# Patient Record
Sex: Male | Born: 1985 | Race: White | Hispanic: No | Marital: Single | Smoking: Current every day smoker
Health system: Southern US, Community
[De-identification: ages and names within clinical notes are randomized; demographics above are authoritative.]

## PROBLEM LIST (undated history)

## (undated) DIAGNOSIS — J45909 Unspecified asthma, uncomplicated: Secondary | ICD-10-CM

---

## 2014-12-03 ENCOUNTER — Emergency Department (HOSPITAL_BASED_OUTPATIENT_CLINIC_OR_DEPARTMENT_OTHER)
Admission: EM | Admit: 2014-12-03 | Discharge: 2014-12-03 | Disposition: A | Discharge status: Home / Self Care | Payer: Self-pay | Attending: Emergency Medicine | Admitting: Emergency Medicine

## 2014-12-03 ENCOUNTER — Encounter (HOSPITAL_BASED_OUTPATIENT_CLINIC_OR_DEPARTMENT_OTHER): Payer: Self-pay | Admitting: *Deleted

## 2014-12-03 ENCOUNTER — Emergency Department (HOSPITAL_BASED_OUTPATIENT_CLINIC_OR_DEPARTMENT_OTHER): Payer: Self-pay

## 2014-12-03 DIAGNOSIS — J45901 Unspecified asthma with (acute) exacerbation: Secondary | ICD-10-CM | POA: Insufficient documentation

## 2014-12-03 DIAGNOSIS — J209 Acute bronchitis, unspecified: Secondary | ICD-10-CM

## 2014-12-03 DIAGNOSIS — Z7952 Long term (current) use of systemic steroids: Secondary | ICD-10-CM | POA: Insufficient documentation

## 2014-12-03 DIAGNOSIS — R059 Cough, unspecified: Secondary | ICD-10-CM

## 2014-12-03 DIAGNOSIS — Z720 Tobacco use: Secondary | ICD-10-CM | POA: Insufficient documentation

## 2014-12-03 DIAGNOSIS — R05 Cough: Secondary | ICD-10-CM

## 2014-12-03 HISTORY — DX: Unspecified asthma, uncomplicated: J45.909

## 2014-12-03 MED ORDER — ALBUTEROL SULFATE (2.5 MG/3ML) 0.083% IN NEBU
5.0000 mg | INHALATION_SOLUTION | Freq: Once | RESPIRATORY_TRACT | Status: AC
  Administered 2014-12-03: 5 mg via RESPIRATORY_TRACT
  Filled 2014-12-03: qty 6

## 2014-12-03 MED ORDER — PREDNISONE 10 MG PO TABS
60.0000 mg | ORAL_TABLET | Freq: Once | ORAL | Status: AC
  Administered 2014-12-03: 60 mg via ORAL
  Filled 2014-12-03 (×2): qty 1

## 2014-12-03 MED ORDER — IPRATROPIUM BROMIDE 0.02 % IN SOLN
0.5000 mg | Freq: Once | RESPIRATORY_TRACT | Status: AC
  Administered 2014-12-03: 0.5 mg via RESPIRATORY_TRACT
  Filled 2014-12-03: qty 2.5

## 2014-12-03 MED ORDER — ALBUTEROL SULFATE HFA 108 (90 BASE) MCG/ACT IN AERS
2.0000 | INHALATION_SPRAY | Freq: Once | RESPIRATORY_TRACT | Status: AC
  Administered 2014-12-03: 2 via RESPIRATORY_TRACT
  Filled 2014-12-03: qty 6.7

## 2014-12-03 MED ORDER — IPRATROPIUM-ALBUTEROL 0.5-2.5 (3) MG/3ML IN SOLN
RESPIRATORY_TRACT | Status: AC
  Filled 2014-12-03: qty 3

## 2014-12-03 MED ORDER — PREDNISONE 20 MG PO TABS: ORAL_TABLET | ORAL | Status: AC

## 2014-12-03 MED ORDER — IPRATROPIUM-ALBUTEROL 0.5-2.5 (3) MG/3ML IN SOLN
3.0000 mL | Freq: Once | RESPIRATORY_TRACT | Status: AC
  Administered 2014-12-03: 3 mL via RESPIRATORY_TRACT

## 2014-12-03 MED ORDER — ALBUTEROL SULFATE (2.5 MG/3ML) 0.083% IN NEBU
2.5000 mg | INHALATION_SOLUTION | Freq: Once | RESPIRATORY_TRACT | Status: AC
  Administered 2014-12-03: 2.5 mg via RESPIRATORY_TRACT
  Filled 2014-12-03: qty 3

## 2014-12-03 MED ORDER — HYDROCOD POLST-CHLORPHEN POLST 10-8 MG/5ML PO LQCR: 5.0000 mL | Freq: Two times a day (BID) | ORAL | Status: AC | PRN

## 2014-12-03 NOTE — ED Notes (Signed)
Pt reports frequent travel over the last 6 months- c/o cough and SOB worse for the last 2 weeks

## 2014-12-03 NOTE — ED Provider Notes (Signed)
CSN: [id number]     Arrival date & time 12/03/14  1650 History   First MD Initiated Contact with Patient 12/03/14 1704     Chief Complaint  Patient presents with  . Shortness of Breath  . Cough     (Consider location/radiation/quality/duration/timing/severity/associated sxs/prior Treatment) HPI Comments: 29 year old male presenting with intermittent cough over the past 2 years, worsening over the past 2 weeks. Symptoms worse in the winter. Cough is productive with green mucus, worse in the morning and in the middle of the night. Over the past 2 weeks, he's been experiencing shortness of breath, worse on exertion. Denies fevers. Admits to associated nasal congestion. girlfriend is sick with similar symptoms. He has not tried any alleviating factors for his symptoms. He received a neb treatment on arrival with no relief.  Patient is a 29 y.o. male presenting with shortness of breath and cough. The history is provided by the patient.  Shortness of Breath Associated symptoms: cough   Cough Associated symptoms: shortness of breath     Past Medical History  Diagnosis Date  . Asthma    History reviewed. No pertinent past surgical history. No family history on file. History  Substance Use Topics  . Smoking status: Current Every Day Smoker    Types: Cigarettes  . Smokeless tobacco: Former Neurosurgeon  . Alcohol Use: Yes    Review of Systems  HENT: Positive for congestion.   Respiratory: Positive for cough and shortness of breath.   All other systems reviewed and are negative.     Allergies  Review of patient's allergies indicates no known allergies.  Home Medications   Prior to Admission medications   Medication Sig Start Date End Date Taking? Authorizing Provider  chlorpheniramine-HYDROcodone (TUSSIONEX PENNKINETIC ER) 10-8 MG/5ML LQCR Take 5 mLs by mouth every 12 (twelve) hours as needed for cough. 12/03/14   Kathrynn Speed, PA-C  predniSONE (DELTASONE) 20 MG tablet 2 tabs po daily x  4 days 12/03/14   Nada Boozer Katelin Kutsch, PA-C   BP 147/92 mmHg  Pulse 81  Temp(Src) 97.9 F (36.6 C) (Oral)  Resp 18  Ht  (1.727 m)  Wt 219 lb (99.338 kg)  BMI 33.31 kg/m2  SpO2 100% Physical Exam  Constitutional: He is oriented to person, place, and time. He appears well-developed and well-nourished. No distress.  HENT:  Head: Normocephalic and atraumatic.  Mouth/Throat: No oropharyngeal exudate or posterior oropharyngeal edema.  Nasal mucosal edema. Postnasal drip.  Eyes: Conjunctivae and EOM are normal.  Neck: Normal range of motion. Neck supple.  Cardiovascular: Normal rate, regular rhythm and normal heart sounds.   Pulmonary/Chest: Effort normal.  Diffuse inspiratory and expiratory wheezes bilateral.  Musculoskeletal: Normal range of motion. He exhibits no edema.  Neurological: He is alert and oriented to person, place, and time.  Skin: Skin is warm and dry.  Psychiatric: He has a normal mood and affect. His behavior is normal.  Nursing note and vitals reviewed.   ED Course  Procedures (including critical care time) Labs Review Labs Reviewed - No data to display  Imaging Review Dg Chest 2 View  12/03/2014   CLINICAL DATA:  frequent travel over the last 6 months- c/o cough and SOB worse for the last 2 weeks states he has been cough for 22 years Smoker x 16 years 1 ppd  EXAM: CHEST - 2 VIEW  COMPARISON:  None available  FINDINGS: Lungs are clear. Heart size and mediastinal contours are within normal limits. No effusion. Visualized skeletal  structures are unremarkable.  IMPRESSION: No acute cardiopulmonary disease.   Electronically Signed   By: Corlis Leak  Hassell M.D.   On: 12/03/2014 17:41     EKG Interpretation None      MDM   Final diagnoses:  Bronchitis with bronchospasm  Cough   Nontoxic appearing and in no apparent distress. Afebrile, vital signs stable. Diffuse inspiratory Wheezes bilateral. Patient received neb treatment on arrival ordered in triage. Given cough has  been present for so long, he is a smoker, chest x-ray obtained and negative. After oral prednisone and second DuoNeb treatment, significant improvement of breath sounds. Patient reports he is feeling much better. Will discharge patient home with albuterol inhaler, prednisone burst and Tussionex. Stable for discharge. Resources given for PCP follow-up. Return precautions given. Patient states understanding of treatment care plan and is agreeable.    Kathrynn SpeedRobyn M Cornelis Kluver, PA-C 12/03/14 1825  Geoffery Lyonsouglas Delo, MD 12/04/14 [phone]

## 2014-12-03 NOTE — Discharge Instructions (Signed)
Use albuterol inhaler every 4-6 hours as needed for cough and wheezing. Take prednisone as prescribed, beginning tomorrow as you were given the first dose in the emergency department today. Take Tussionex for severe cough only. No driving or operating heavy machinery while taking tussionex. This medication may cause drowsiness. Follow-up with one of the resources below to establish care with a primary care physician.  Acute Bronchitis Bronchitis is inflammation of the airways that extend from the windpipe into the lungs (bronchi). The inflammation often causes mucus to develop. This leads to a cough, which is the most common symptom of bronchitis.  In acute bronchitis, the condition usually develops suddenly and goes away over time, usually in a couple weeks. Smoking, allergies, and asthma can make bronchitis worse. Repeated episodes of bronchitis may cause further lung problems.  CAUSES Acute bronchitis is most often caused by the same virus that causes a cold. The virus can spread from person to person (contagious) through coughing, sneezing, and touching contaminated objects. SIGNS AND SYMPTOMS   Cough.   Fever.   Coughing up mucus.   Body aches.   Chest congestion.   Chills.   Shortness of breath.   Sore throat.  DIAGNOSIS  Acute bronchitis is usually diagnosed through a physical exam. Your health care provider will also ask you questions about your medical history. Tests, such as chest X-rays, are sometimes done to rule out other conditions.  TREATMENT  Acute bronchitis usually goes away in a couple weeks. Oftentimes, no medical treatment is necessary. Medicines are sometimes given for relief of fever or cough. Antibiotic medicines are usually not needed but may be prescribed in certain situations. In some cases, an inhaler may be recommended to help reduce shortness of breath and control the cough. A cool mist vaporizer may also be used to help thin bronchial secretions and  make it easier to clear the chest.  HOME CARE INSTRUCTIONS  Get plenty of rest.   Drink enough fluids to keep your urine clear or pale yellow (unless you have a medical condition that requires fluid restriction). Increasing fluids may help thin your respiratory secretions (sputum) and reduce chest congestion, and it will prevent dehydration.   Take medicines only as directed by your health care provider.  If you were prescribed an antibiotic medicine, finish it all even if you start to feel better.  Avoid smoking and secondhand smoke. Exposure to cigarette smoke or irritating chemicals will make bronchitis worse. If you are a smoker, consider using nicotine gum or skin patches to help control withdrawal symptoms. Quitting smoking will help your lungs heal faster.   Reduce the chances of another bout of acute bronchitis by washing your hands frequently, avoiding people with cold symptoms, and trying not to touch your hands to your mouth, nose, or eyes.   Keep all follow-up visits as directed by your health care provider.  SEEK MEDICAL CARE IF: Your symptoms do not improve after 1 week of treatment.  SEEK IMMEDIATE MEDICAL CARE IF:  You develop an increased fever or chills.   You have chest pain.   You have severe shortness of breath.  You have bloody sputum.   You develop dehydration.  You faint or repeatedly feel like you are going to pass out.  You develop repeated vomiting.  You develop a severe headache. MAKE SURE YOU:   Understand these instructions.  Will watch your condition.  Will get help right away if you are not doing well or get worse. Document Released:  11/08/2004 Document Revised: 02/15/2014 Document Reviewed: 03/24/2013 [facility] Patient Information 2015 [place], [place]. This information is not intended to replace advice given to you by your health care provider. Make sure you discuss any questions you have with your health care provider.  Cough,  Adult  A cough is a reflex that helps clear your throat and airways. It can help heal the body or may be a reaction to an irritated airway. A cough may only last 2 or 3 weeks (acute) or may last more than 8 weeks (chronic).  CAUSES Acute cough:  Viral or bacterial infections. Chronic cough:  Infections.  Allergies.  Asthma.  Post-nasal drip.  Smoking.  Heartburn or acid reflux.  Some medicines.  Chronic lung problems (COPD).  Cancer. SYMPTOMS   Cough.  Fever.  Chest pain.  Increased breathing rate.  High-pitched whistling sound when breathing (wheezing).  Colored mucus that you cough up (sputum). TREATMENT   A bacterial cough may be treated with antibiotic medicine.  A viral cough must run its course and will not respond to antibiotics.  Your caregiver may recommend other treatments if you have a chronic cough. HOME CARE INSTRUCTIONS   Only take over-the-counter or prescription medicines for pain, discomfort, or fever as directed by your caregiver. Use cough suppressants only as directed by your caregiver.  Use a cold steam vaporizer or humidifier in your bedroom or home to help loosen secretions.  Sleep in a semi-upright position if your cough is worse at night.  Rest as needed.  Stop smoking if you smoke. SEEK IMMEDIATE MEDICAL CARE IF:   You have pus in your sputum.  Your cough starts to worsen.  You cannot control your cough with suppressants and are losing sleep.  You begin coughing up blood.  You have difficulty breathing.  You develop pain which is getting worse or is uncontrolled with medicine.  You have a fever. MAKE SURE YOU:   Understand these instructions.  Will watch your condition.  Will get help right away if you are not doing well or get worse. Document Released: 03/30/2011 Document Revised: 12/24/2011 Document Reviewed: 03/30/2011 [facility] Patient Information 2015 [place], [place]. This information is not intended to  replace advice given to you by your health care provider. Make sure you discuss any questions you have with your health care provider. Asthma, Acute Bronchospasm Acute bronchospasm caused by asthma is also referred to as an asthma attack. Bronchospasm means your air passages become narrowed. The narrowing is caused by inflammation and tightening of the muscles in the air tubes (bronchi) in your lungs. This can make it hard to breathe or cause you to wheeze and cough. CAUSES Possible triggers are:  Animal dander from the skin, hair, or feathers of animals.  Dust mites contained in house dust.  Cockroaches.  Pollen from trees or grass.  Mold.  Cigarette or tobacco smoke.  Air pollutants such as dust, household cleaners, hair sprays, aerosol sprays, paint fumes, strong chemicals, or strong odors.  Cold air or weather changes. Cold air may trigger inflammation. Winds increase molds and pollens in the air.  Strong emotions such as crying or laughing hard.  Stress.  Certain medicines such as aspirin or beta-blockers.  Sulfites in foods and drinks, such as dried fruits and wine.  Infections or inflammatory conditions, such as a flu, cold, or inflammation of the nasal membranes (rhinitis).  Gastroesophageal reflux disease (GERD). GERD is a condition where stomach acid backs up into your esophagus.  Exercise or strenuous activity.  SIGNS AND SYMPTOMS   Wheezing.  Excessive coughing, particularly at night.  Chest tightness.  Shortness of breath. DIAGNOSIS  Your health care provider will ask you about your medical history and perform a physical exam. A chest X-ray or blood testing may be performed to look for other causes of your symptoms or other conditions that may have triggered your asthma attack. TREATMENT  Treatment is aimed at reducing inflammation and opening up the airways in your lungs. Most asthma attacks are treated with inhaled medicines. These include quick relief  or rescue medicines (such as bronchodilators) and controller medicines (such as inhaled corticosteroids). These medicines are sometimes given through an inhaler or a nebulizer. Systemic steroid medicine taken by mouth or given through an IV tube also can be used to reduce the inflammation when an attack is moderate or severe. Antibiotic medicines are only used if a bacterial infection is present.  HOME CARE INSTRUCTIONS   Rest.  Drink plenty of liquids. This helps the mucus to remain thin and be easily coughed up. Only use caffeine in moderation and do not use alcohol until you have recovered from your illness.  Do not smoke. Avoid being exposed to secondhand smoke.  You play a critical role in keeping yourself in good health. Avoid exposure to things that cause you to wheeze or to have breathing problems.  Keep your medicines up-to-date and available. Carefully follow your health care provider's treatment plan.  Take your medicine exactly as prescribed.  When pollen or pollution is bad, keep windows closed and use an air conditioner or go to places with air conditioning.  Asthma requires careful medical care. See your health care provider for a follow-up as advised. If you are more than [redacted] weeks pregnant and you were prescribed any new medicines, let your obstetrician know about the visit and how you are doing. Follow up with your health care provider as directed.  After you have recovered from your asthma attack, make an appointment with your outpatient doctor to talk about ways to reduce the likelihood of future attacks. If you do not have a doctor who manages your asthma, make an appointment with a primary care doctor to discuss your asthma. SEEK IMMEDIATE MEDICAL CARE IF:   You are getting worse.  You have trouble breathing. If severe, call your local emergency services (911 in the [place]  You develop chest pain or discomfort.  You are vomiting.  You are not able to keep fluids  down.  You are coughing up yellow, green, brown, or bloody sputum.  You have a fever and your symptoms suddenly get worse.  You have trouble swallowing. MAKE SURE YOU:   Understand these instructions.  Will watch your condition.  Will get help right away if you are not doing well or get worse. Document Released: 01/16/2007 Document Revised: 10/06/2013 Document Reviewed: 04/08/2013 [facility] Patient Information 2015 [place], [place]. This information is not intended to replace advice given to you by your health care provider. Make sure you discuss any questions you have with your health care provider. RESOURCE GUIDE  Chronic Pain Problems: Contact [facility]  [phone] Patients need to be referred by their primary care doctor.  Insufficient Money for Medicine: Contact [employer]:  call "211."   No Primary Care Doctor: - Call [facility]  [phone] - can help you locate a primary care doctor that  accepts your insurance, provides certain services, etc. - Physician Referral [phone]  Agencies that provide inexpensive  medical care: - [facility]  [phone] - [facility]  [phone] - [facility]  [phone] - Women's Clinic  [phone] - [facility]  [phone] [facility]  [phone]  Medicaid-accepting [facility] Providers: - [facility]- [address]  [phone], Mon-Fri 9am-7pm, Sat 9am-1pm - [facility]- [address] [place], [address] [place]  [phone] - [facility]- [address] [place]  [phone] [facility]- [address]  [phone] - Renaye Rakers- [address] [place], [address], [phone]  Only accepts [place] patients after they have their name  applied to their card  Self Pay (no insurance) in [place]: - Sickle Cell Patients: Dr Willey Blade, [facility]  Internal Medicine  [address] [place], [phone] - [facility]- [address] [place]  [phone]       [facility] [place]- [address]       -     [facility]- see information above (Speak to [employer] if you do not have insurance)       -  [facility]- [address],  [phone]       -  [facility]- [address] [phone]       -  Dr Julio Sicks-  [address], [place], [phone]       -  [facility] - [address] [phone]       -  [facility]- [address], [phone], also [address] [phone]       -    [facility]- [address] [place], [phone], 1st & 3rd Saturday        every month, 10am-1pm  1) Find a Doctor and Pay Out of Pocket Although you won't have to find out who is covered by your insurance plan, it is a good idea to ask around and get recommendations. You will then need to call the office and see if the doctor you have chosen will accept you as a new patient and what types of options they offer for patients who are self-pay. Some doctors offer discounts or will set up payment plans for their patients who do not have insurance, but you will need to ask so you aren't surprised when you get to your appointment.  2) Contact Your Local Health Department Not all health departments have doctors that can see patients for sick visits, but many do, so it is worth a call to see if yours does. If you don't know where your local health department is, you can check in your phone book. The CDC also has a tool to help you locate your state's health department, and many state websites also have listings of all of their local health departments.  3) Find a Walk-in Clinic If your illness is not likely to be very severe or complicated, you may want to try a walk in clinic. These are popping up all over the country in pharmacies,  drugstores, and shopping centers. They're usually staffed by nurse practitioners or physician assistants that have been trained to treat common illnesses and complaints. They're usually fairly quick and inexpensive. However, if you have serious medical issues or chronic medical problems, these are probably  not your best option

## 2016-04-16 IMAGING — DX DG CHEST 2V
2 series · 2 of 2 positions shown · non-contrast
Comparison: None available

CLINICAL DATA: frequent travel over the last 6 months- c/o cough
and SOB worse for the last 2 weeks states he has been cough for 22
years Smoker x 16 years 1 ppd

EXAM:
CHEST - 2 VIEW

[chest pa]
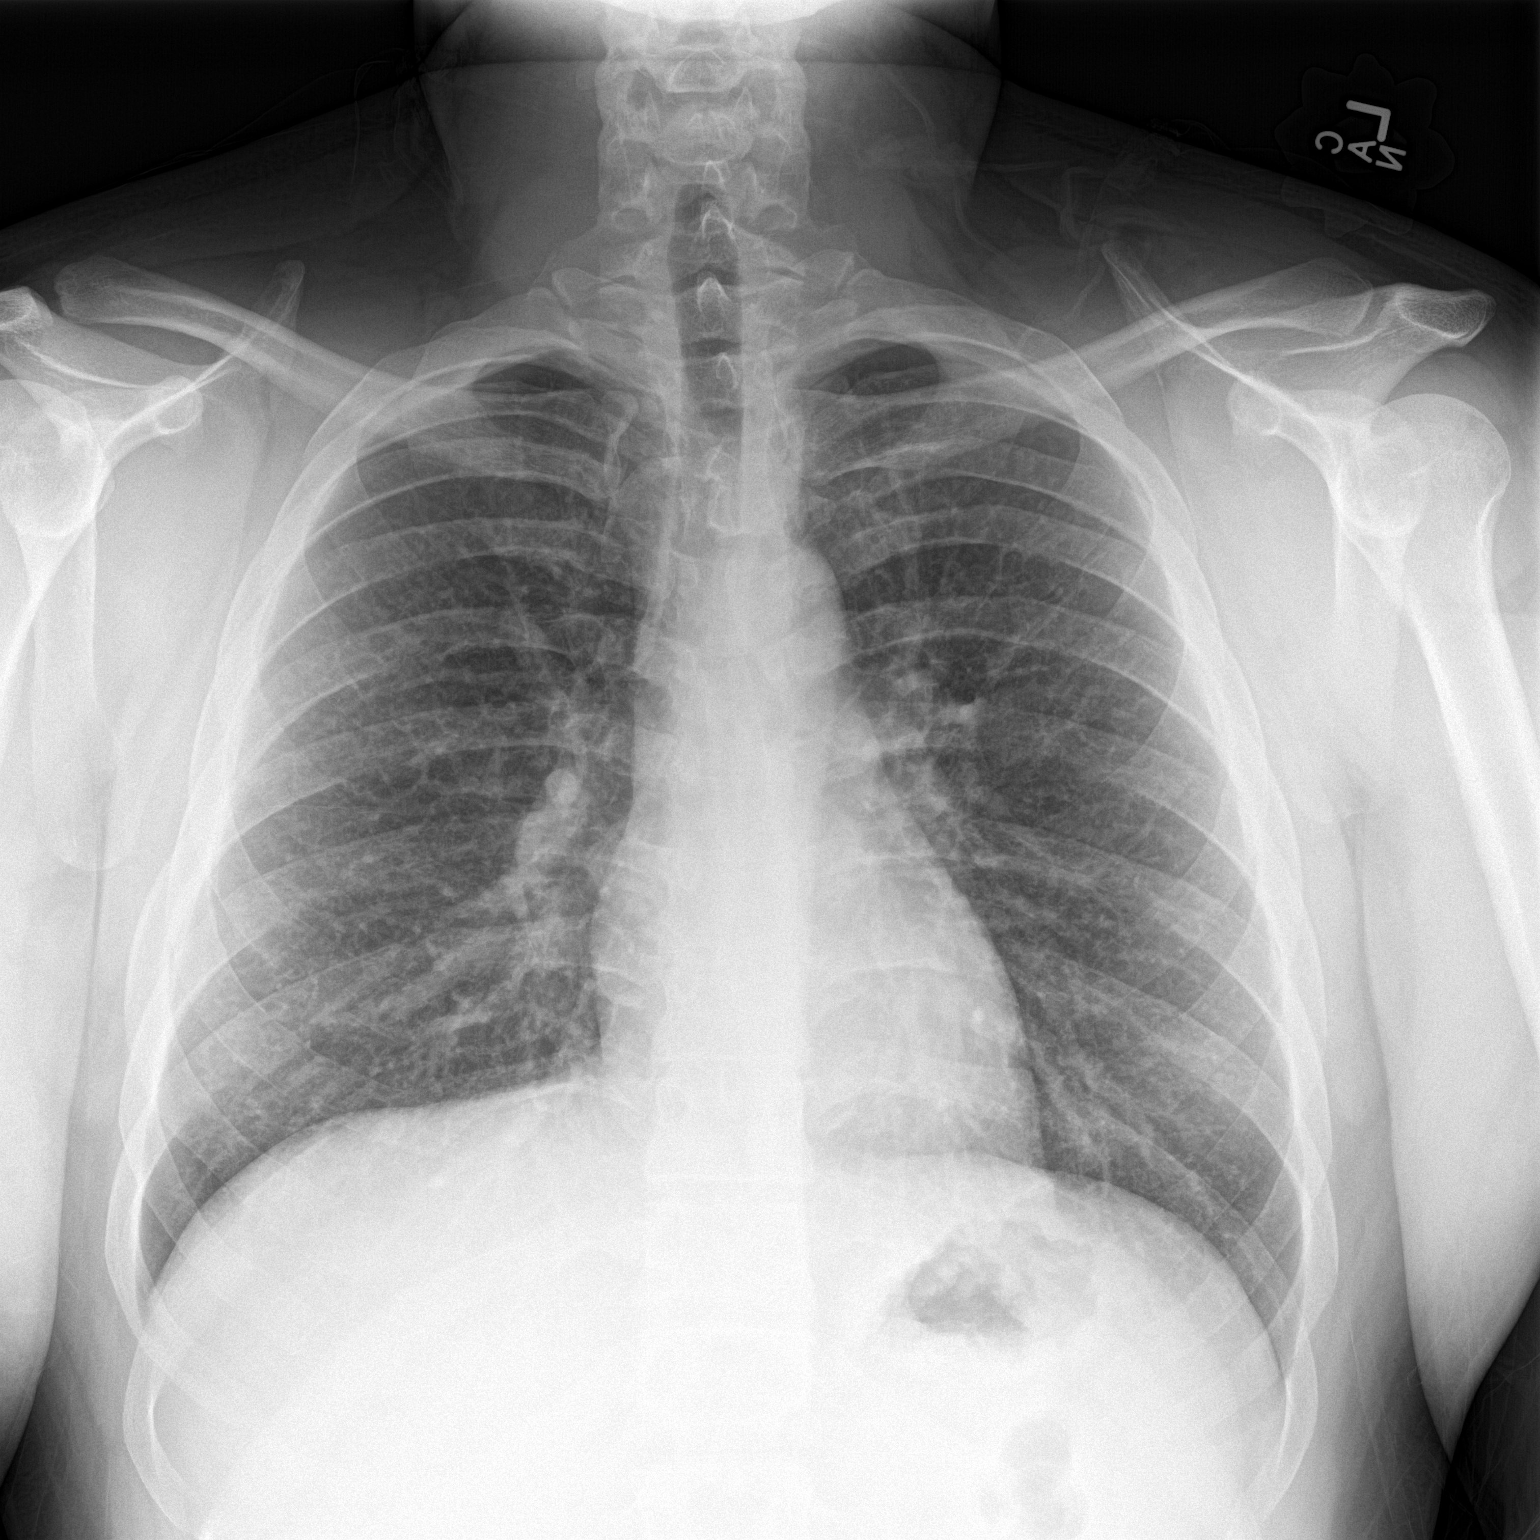

[chest lat]
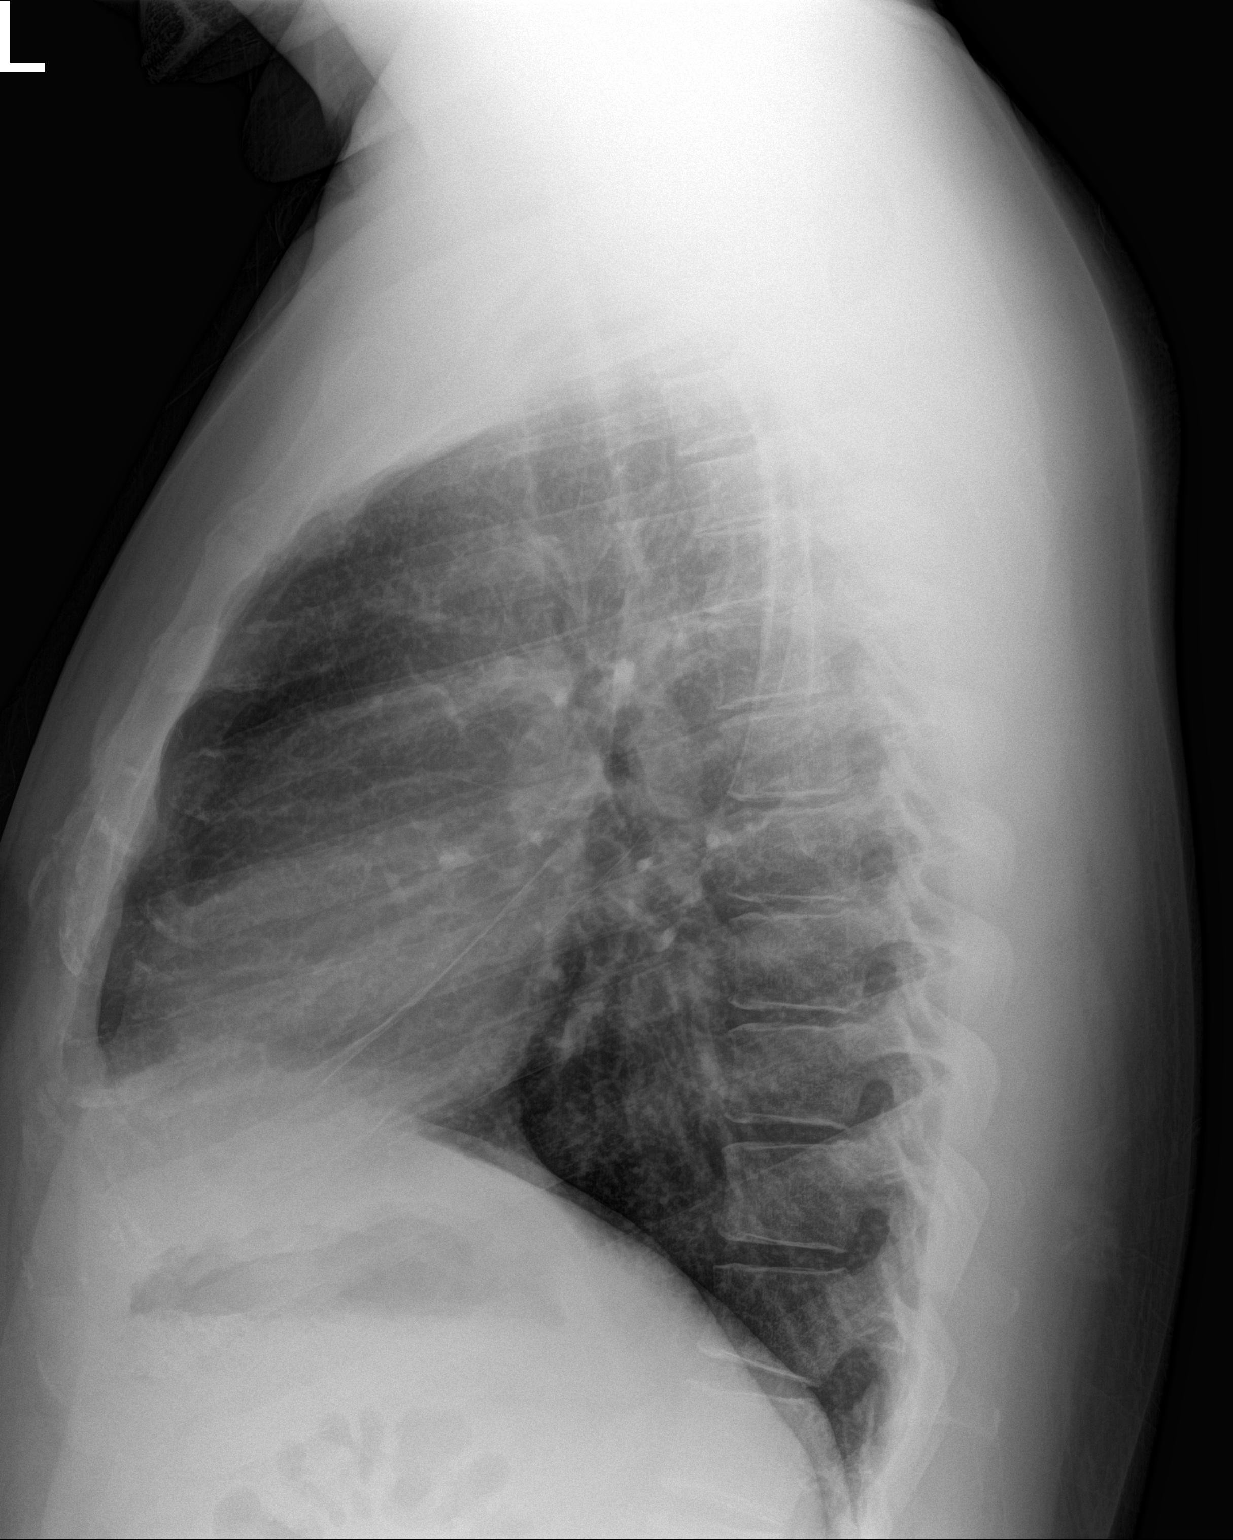

[2 of 2 positions shown; findings below may reference images not displayed]

FINDINGS: Lungs are clear. Heart size and mediastinal contours are within
normal limits.
No effusion.
Visualized skeletal structures are unremarkable.
IMPRESSION: No acute cardiopulmonary disease.
